# Patient Record
Sex: Male | Born: 1960 | Race: White | Hispanic: No | Marital: Married
Health system: Southern US, Community
[De-identification: ages and names within clinical notes are randomized; demographics above are authoritative.]

---

## 2008-05-27 ENCOUNTER — Ambulatory Visit: Payer: Self-pay | Admitting: Urology

## 2010-04-23 IMAGING — US US PELVIS LIMITED
1 series · 17 of 25 positions shown · non-contrast
Comparison: none

REASON FOR EXAM: spermatocele
COMMENTS:

[Series 1: us pelvis limited · 17 of 35 slices shown]
[im 1/35]
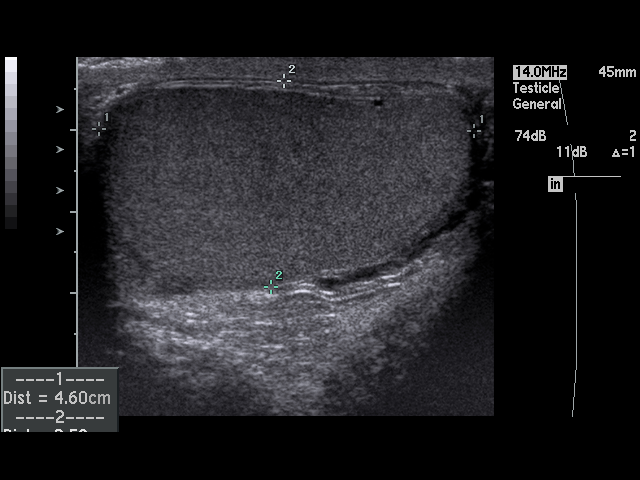
[im 3/35]
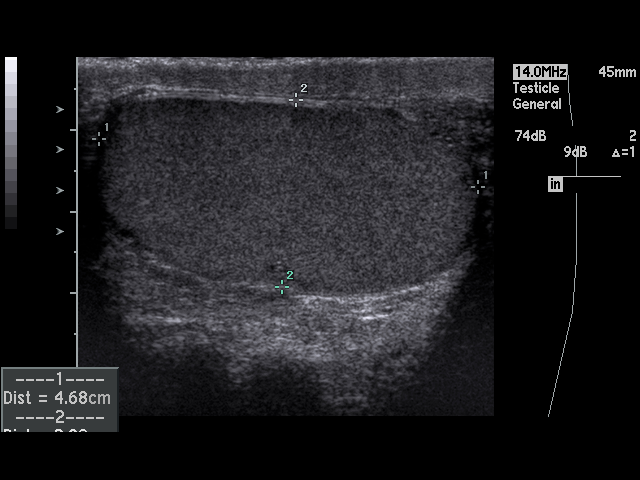
[im 5/35]
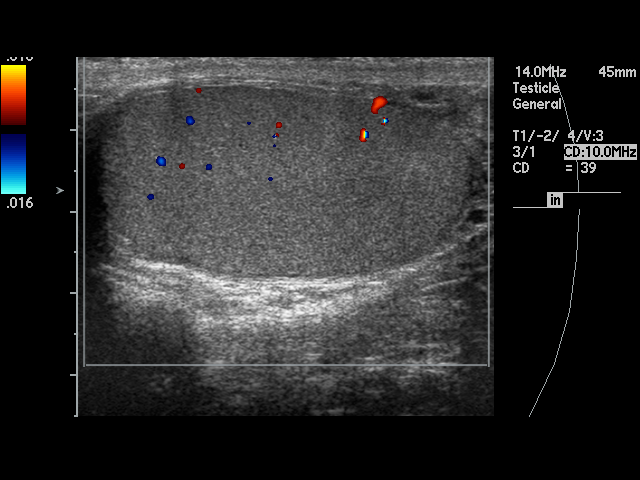
[im 8/35]
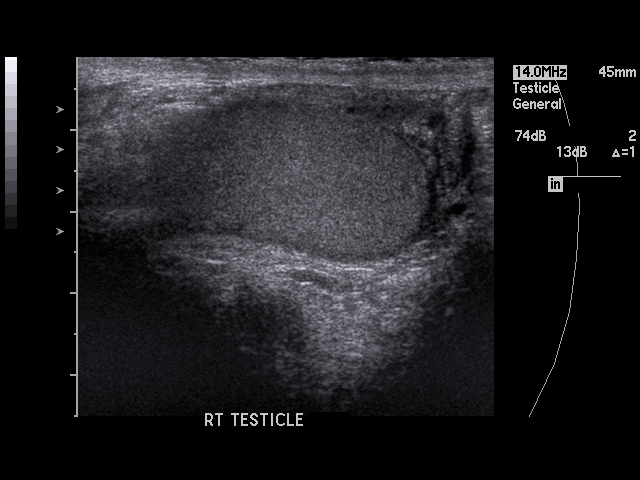
[im 9/35]
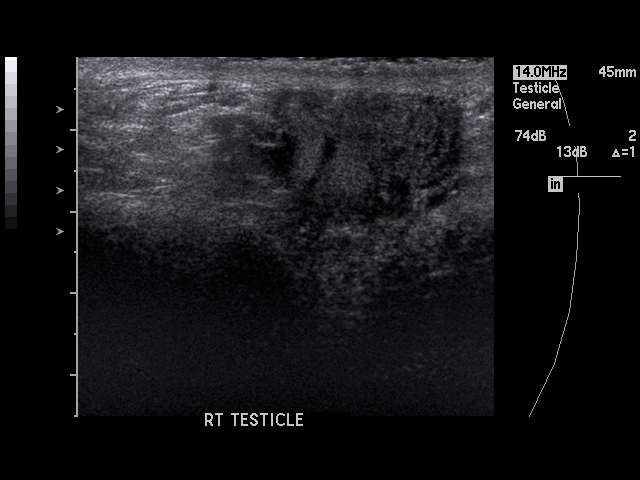
[im 12/35]
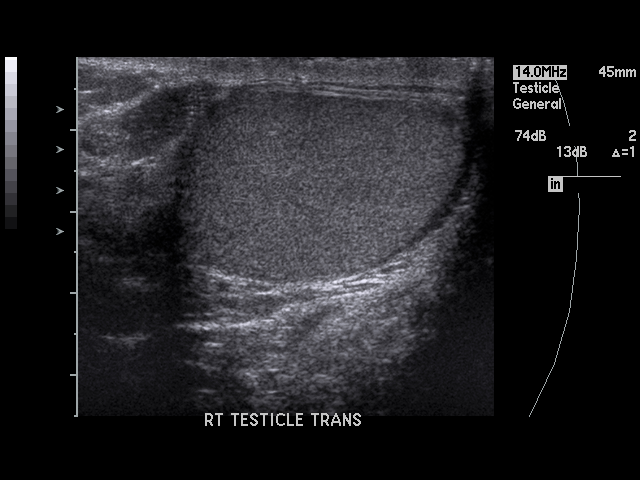
[im 13/35]
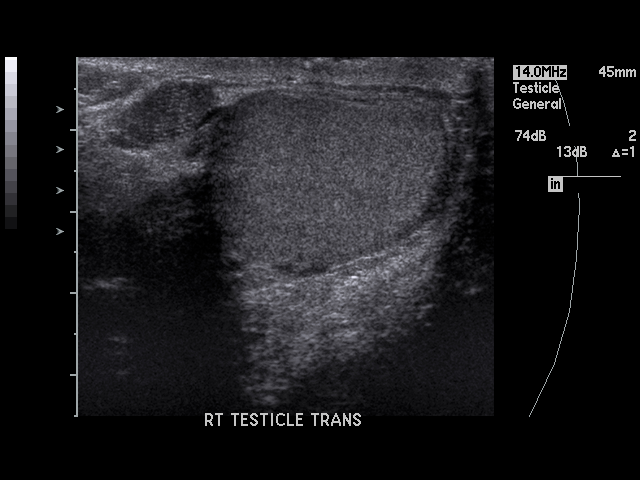
[im 16/35]
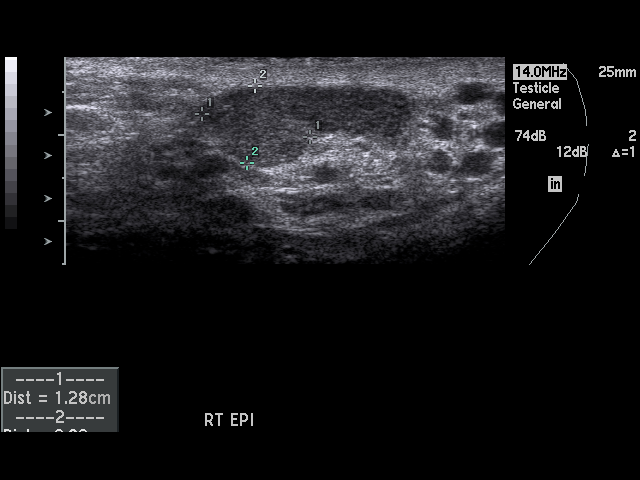
[im 18/35]
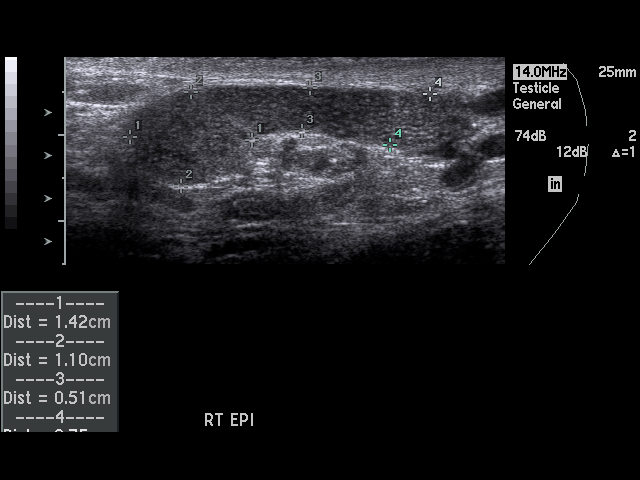
[im 19/35]
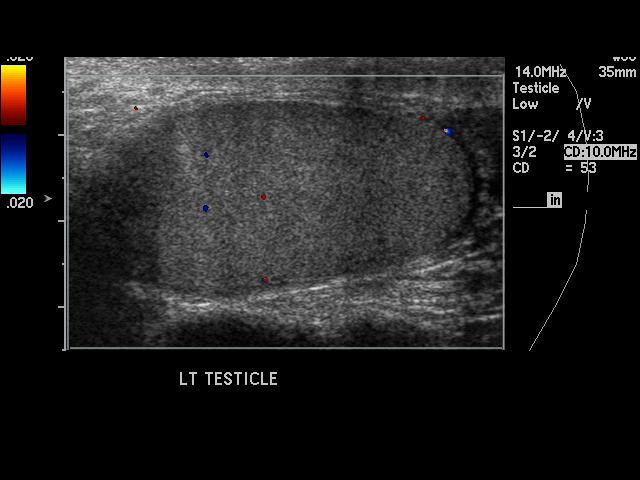
[im 22/35]
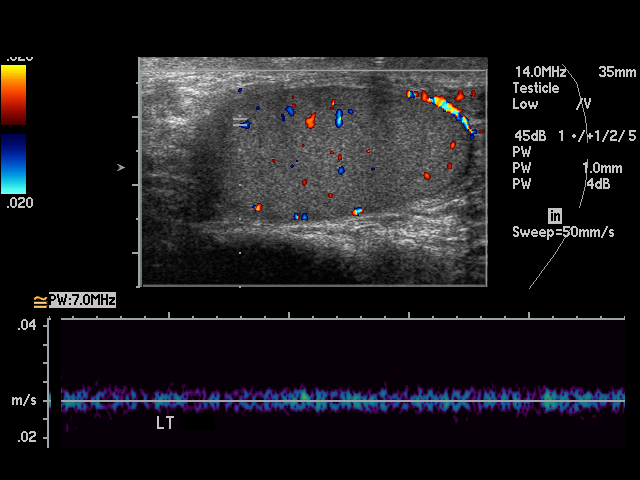
[im 23/35]
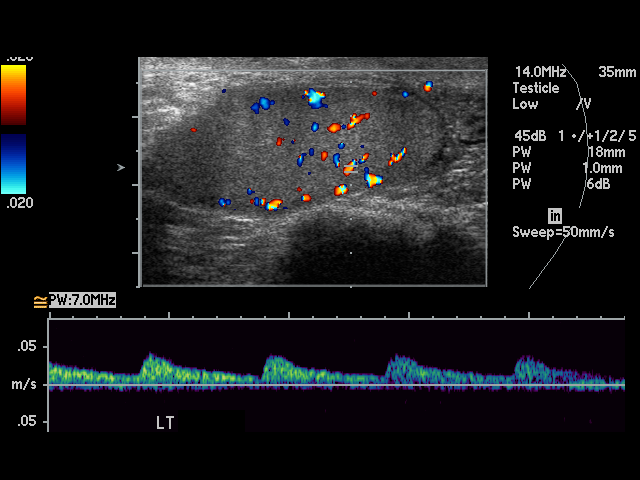
[im 26/35]
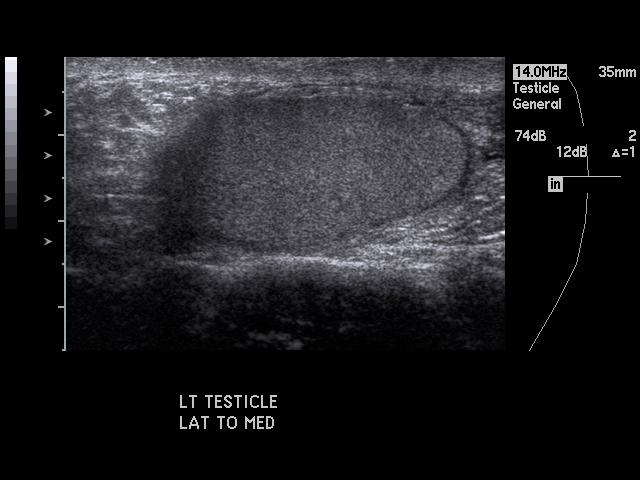
[im 27/35]
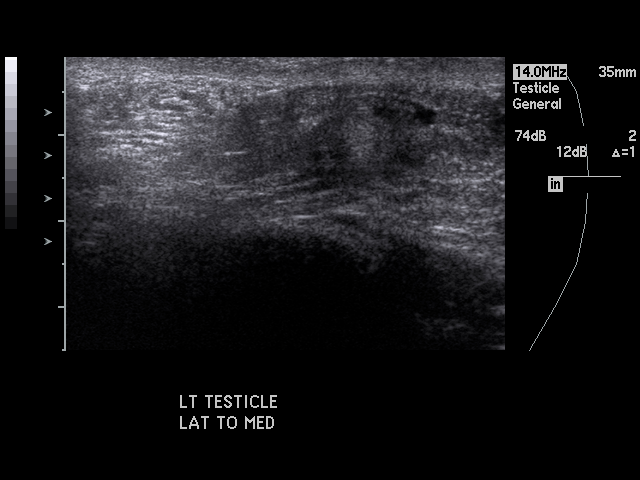
[im 30/35]
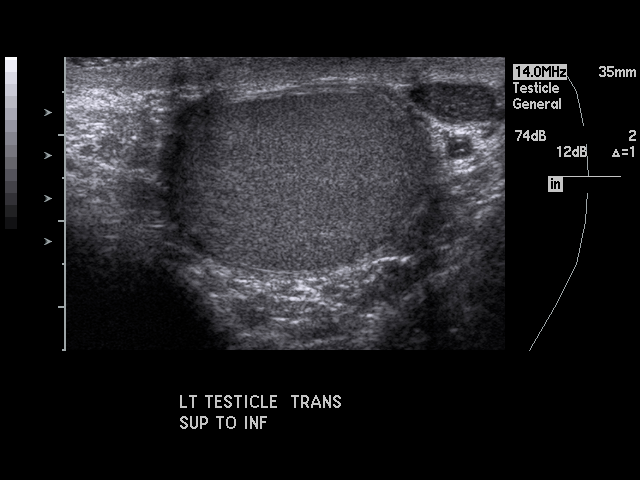
[im 32/35]
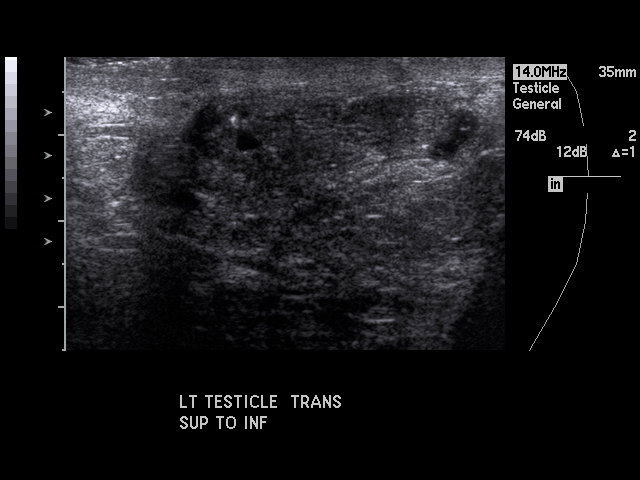
[im 35/35]
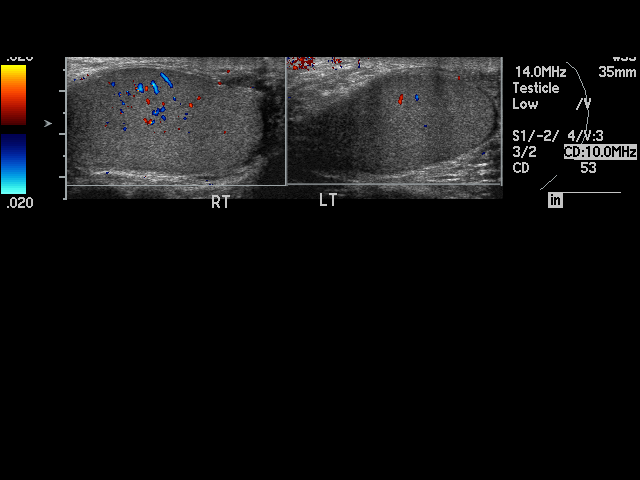

[17 of 25 positions shown; findings below may reference images not displayed]

PROCEDURE:     US  - US TESTICULAR  - May 27, 2008  [DATE]

RESULT:     Ultrasound of the testicles demonstrates the RIGHT testicle
measures 4.6 x 2.5 x 3.6 cm. The LEFT testicle measures 4.7 x 2.3 x 3.6 cm.
The testicles demonstrate a homogeneous echotexture. There is Doppler
evidence of flow to both testicles. The RIGHT epididymis measures 12.8 x
x 13.7 mm. The LEFT epididymis is 13.1 x 8.8 x 11.7 mm. There is no
varicocele or hydrocele.
IMPRESSION: Normal appearing testicular ultrasound.

## 2010-09-26 ENCOUNTER — Ambulatory Visit: Payer: Self-pay | Admitting: Gastroenterology

## 2019-09-10 ENCOUNTER — Ambulatory Visit: Payer: Self-pay | Admitting: *Deleted

## 2019-09-10 NOTE — Telephone Encounter (Signed)
Patient called to check to see how soon he should be tested after potential covid exposure. Patient was exposed on Tuesday 09/08/2019. Not a direct exposure.  Patient was 6 feet away from positive person and patient was wearing a mask.  Discussed with patient the recommendation is to have testing within 6- 8 days of suspected exposure. Care Advice given per protocol.  Patient has an appointment on Monday, 09/14/2019, to be tested at local [facility].  All questions answered.  Patient verbalized understanding.   Reason for Disposition . COVID-19 Testing, questions about  Answer Assessment - Initial Assessment Questions 1. COVID-19 CLOSE CONTACT: "Who is the person with the confirmed or suspected COVID-19 infection that you were exposed to?"     Patient's 28 year old father.   2. PLACE of CONTACT: "Where were you when you were exposed to COVID-19?" (e.g., home, school, medical waiting room; which city?)     Patient had gone to see his father.   3. TYPE of CONTACT: "How much contact was there?" (e.g., sitting next to, live in same house, work in same office, same building)     Patient was wearing a mask and maintained 6 feet away.   4. DURATION of CONTACT: "How long were you in contact with the COVID-19 patient?" (e.g., a few seconds, passed by person, a few minutes, 15 minutes or longer, live with the patient)     Undetermined amount of time.  5. MASK: "Were you wearing a mask?" "Was the other person wearing a mask?" Note: wearing a mask reduces the risk of an otherwise close contact.     Yes 6. DATE of CONTACT: "When did you have contact with a COVID-19 patient?" (e.g., how many days ago)     2 days ago.   7. COMMUNITY SPREAD: "Are there lots of cases of COVID-19 (community spread) where you live?" (See public health department website, if unsure)       8. SYMPTOMS: "Do you have any symptoms?" (e.g., fever, cough, breathing difficulty, loss of taste or smell)    No symptoms at all.   9. PREGNANCY OR  POSTPARTUM: "Is there any chance you are pregnant?" "When was your last menstrual period?" "Did you deliver in the last 2 weeks?"     NA 10. HIGH RISK: "Do you have any heart or lung problems? Do you have a weak immune system?" (e.g., heart failure, COPD, asthma, HIV positive, chemotherapy, renal failure, diabetes mellitus, sickle cell anemia, obesity)       11. TRAVEL: "Have you traveled out of the country recently?" If so, "When and where?" Also ask about out-of-state travel, since the CDC has identified some high-risk cities for community spread in the [place] Note: Travel becomes less relevant if there is widespread community transmission where the patient lives.      No  Protocols used: CORONAVIRUS (COVID-19) EXPOSURE-A-AH

## 2019-09-14 ENCOUNTER — Ambulatory Visit: Payer: Self-pay
